# Patient Record
Sex: Male | Born: 1960 | Race: White | Hispanic: No | Marital: Married | Smoking: Never smoker
Health system: Southern US, Community
[De-identification: ages and names within clinical notes are randomized; demographics above are authoritative.]

## PROBLEM LIST (undated history)

## (undated) DIAGNOSIS — E785 Hyperlipidemia, unspecified: Secondary | ICD-10-CM

## (undated) DIAGNOSIS — G4730 Sleep apnea, unspecified: Secondary | ICD-10-CM

## (undated) DIAGNOSIS — H9190 Unspecified hearing loss, unspecified ear: Secondary | ICD-10-CM

## (undated) DIAGNOSIS — I10 Essential (primary) hypertension: Secondary | ICD-10-CM

## (undated) DIAGNOSIS — R7302 Impaired glucose tolerance (oral): Secondary | ICD-10-CM

## (undated) HISTORY — DX: Sleep apnea, unspecified: G47.30

## (undated) HISTORY — DX: Hyperlipidemia, unspecified: E78.5

## (undated) HISTORY — DX: Essential (primary) hypertension: I10

## (undated) HISTORY — DX: Impaired glucose tolerance (oral): R73.02

## (undated) HISTORY — PX: OTHER SURGICAL HISTORY: SHX169

---

## 1985-08-11 HISTORY — PX: ENDOLYMPHATIC SHUNT DECOMPRESSION: SHX1498

## 1986-08-11 HISTORY — PX: VESTIBULAR NERVE SECTION: SUR1439

## 1988-08-11 HISTORY — PX: HERNIA REPAIR: SHX51

## 2001-04-27 ENCOUNTER — Ambulatory Visit (HOSPITAL_BASED_OUTPATIENT_CLINIC_OR_DEPARTMENT_OTHER): Admission: RE | Admit: 2001-04-27 | Discharge: 2001-04-27 | Payer: Self-pay | Admitting: Orthopedic Surgery

## 2003-04-03 ENCOUNTER — Ambulatory Visit (HOSPITAL_BASED_OUTPATIENT_CLINIC_OR_DEPARTMENT_OTHER): Admission: RE | Admit: 2003-04-03 | Discharge: 2003-04-03 | Payer: Self-pay | Admitting: Orthopedic Surgery

## 2007-07-14 ENCOUNTER — Encounter: Admission: RE | Admit: 2007-07-14 | Discharge: 2007-07-14 | Payer: Self-pay | Admitting: Otolaryngology

## 2008-07-04 ENCOUNTER — Ambulatory Visit: Payer: Self-pay | Admitting: Internal Medicine

## 2008-07-10 ENCOUNTER — Telehealth: Payer: Self-pay | Admitting: Internal Medicine

## 2008-07-12 ENCOUNTER — Encounter: Payer: Self-pay | Admitting: Internal Medicine

## 2008-07-12 ENCOUNTER — Ambulatory Visit: Payer: Self-pay | Admitting: Internal Medicine

## 2008-07-18 ENCOUNTER — Telehealth: Payer: Self-pay | Admitting: Internal Medicine

## 2008-07-31 ENCOUNTER — Encounter: Payer: Self-pay | Admitting: Internal Medicine

## 2008-07-31 ENCOUNTER — Ambulatory Visit (HOSPITAL_COMMUNITY): Admission: RE | Admit: 2008-07-31 | Discharge: 2008-07-31 | Payer: Self-pay | Admitting: Internal Medicine

## 2010-03-06 ENCOUNTER — Emergency Department (HOSPITAL_COMMUNITY): Admission: EM | Admit: 2010-03-06 | Discharge: 2010-03-07 | Payer: Self-pay | Admitting: Emergency Medicine

## 2010-08-06 IMAGING — CR DG BE W/ AIR HIGH DENSITY
15 of 24 series · 15 of 24 positions shown · IV contrast (agent unspecified)
Comparison: Colonoscopy report dated 07/12/2008.

CLINICAL DATA: Incomplete colonoscopy for bright red blood in the
stools.

AIR-CONTRAST BARIUM ENEMA
TECHNIQUE: After obtaining a scout radiograph air-contrast barium
enema was performed under fluoroscopy using high-density barium.
Fluoroscopy Time: 8.1 minutes
Contrast: High density barium and air.

[run (1 of 12)]
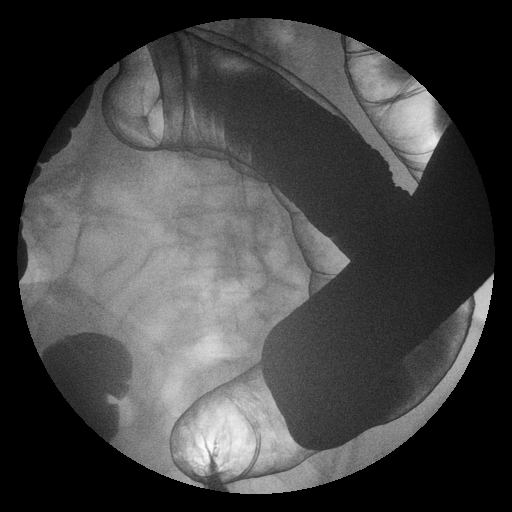

[run (2 of 12)]
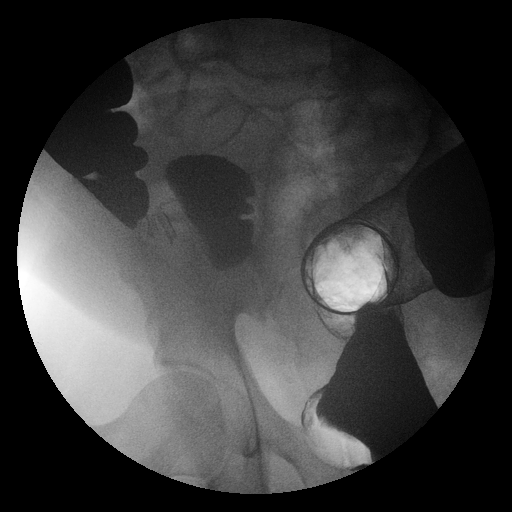

[run (3 of 12)]
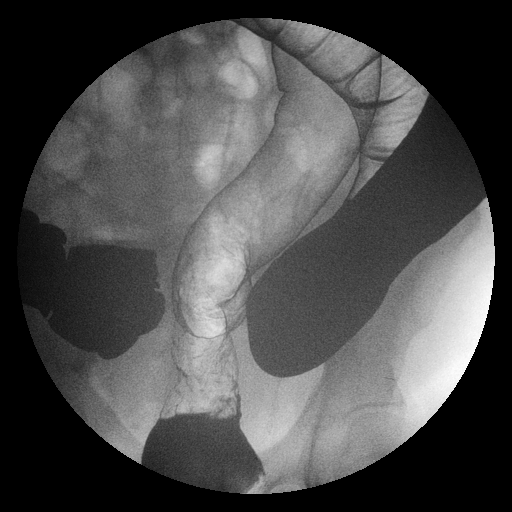

[run (4 of 12)]
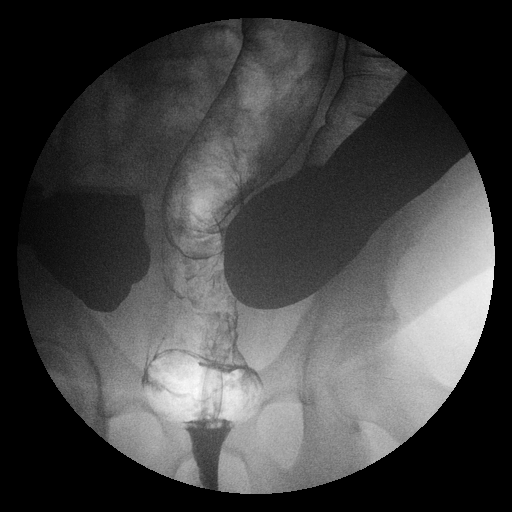

[run (5 of 12)]
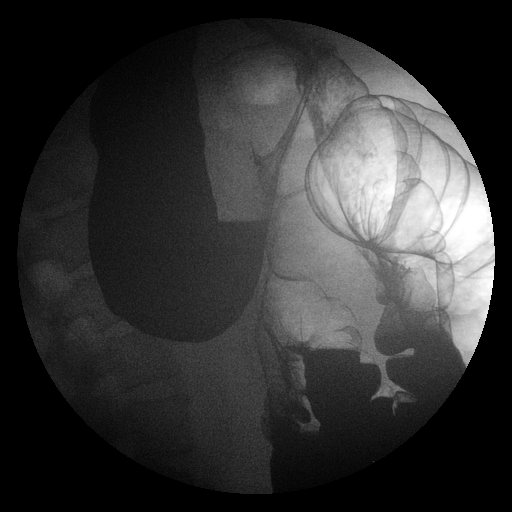

[run (6 of 12)]
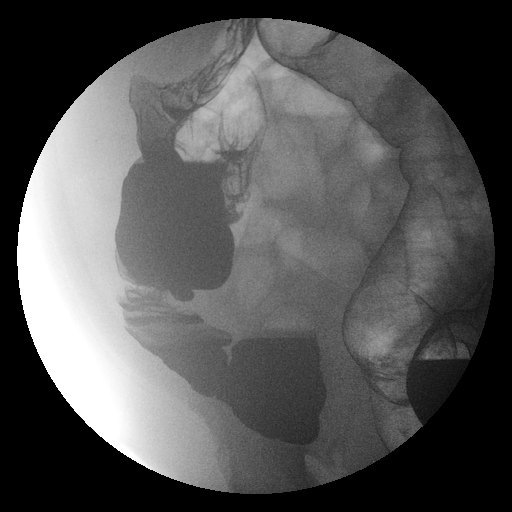

[run (7 of 12)]
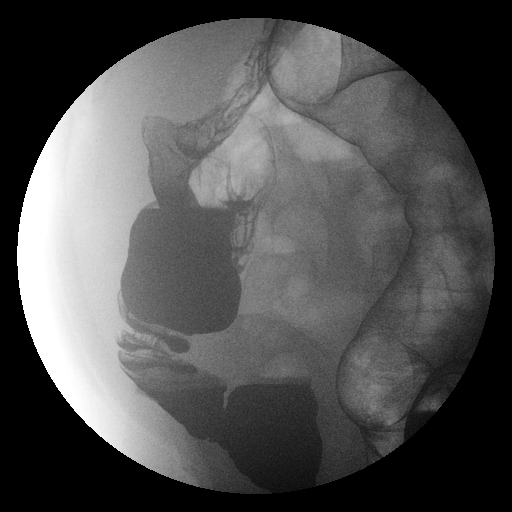

[run (8 of 12)]
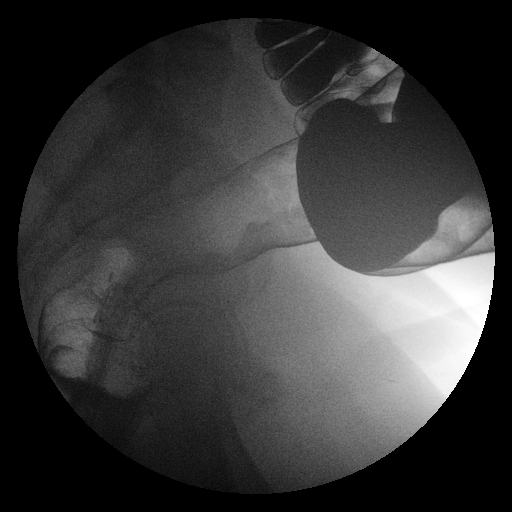

[run (9 of 12)]
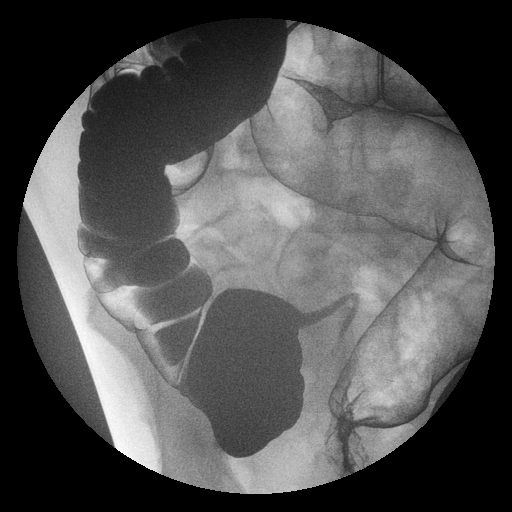

[run (10 of 12)]
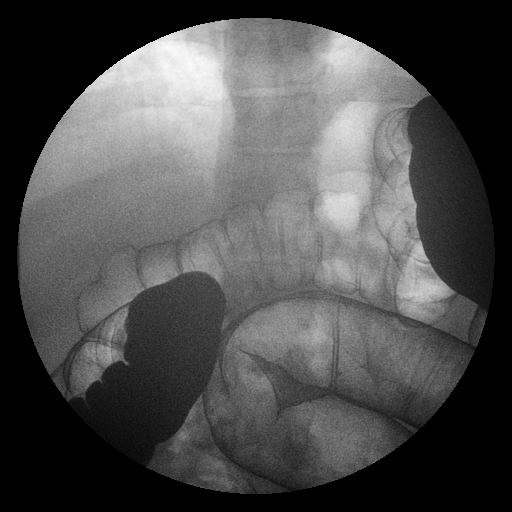

[run (11 of 12)]
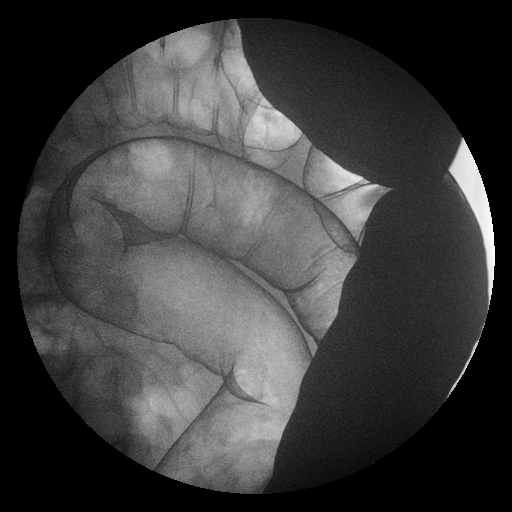

[run (12 of 12)]
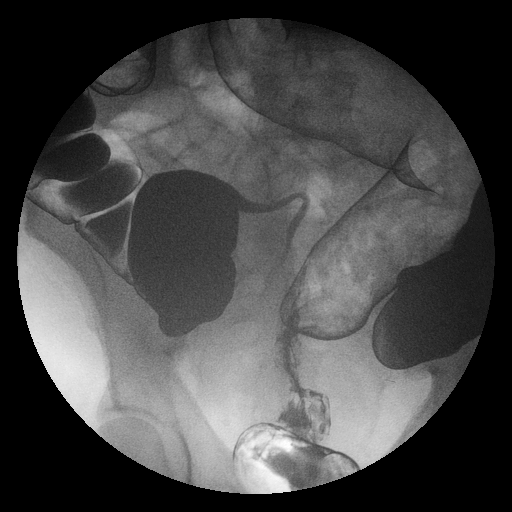

[view not recorded (1 of 3)]
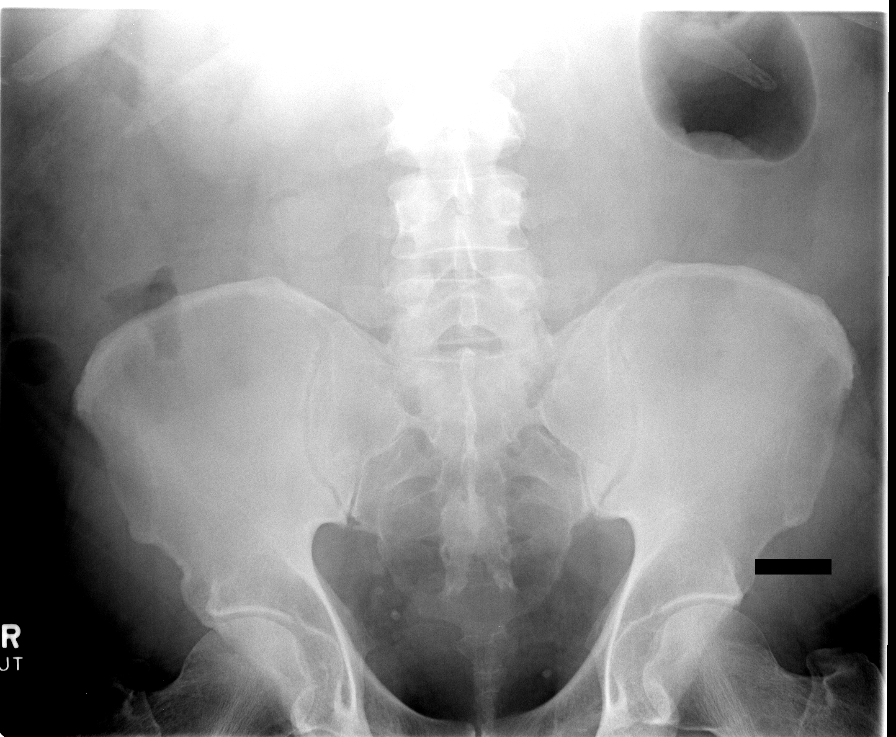

[view not recorded (2 of 3)]
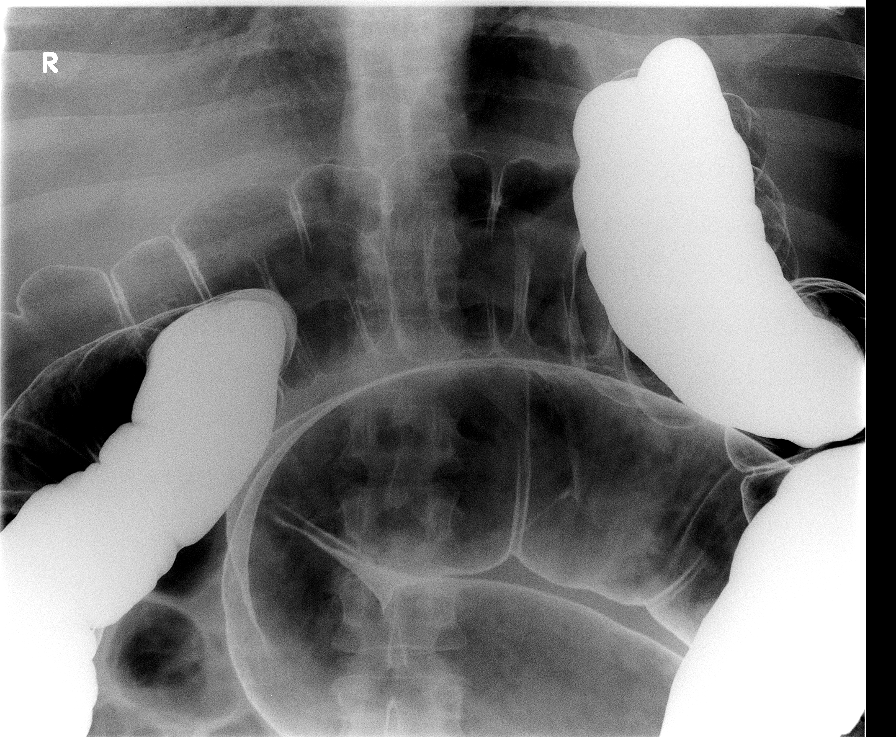

[view not recorded (3 of 3)]
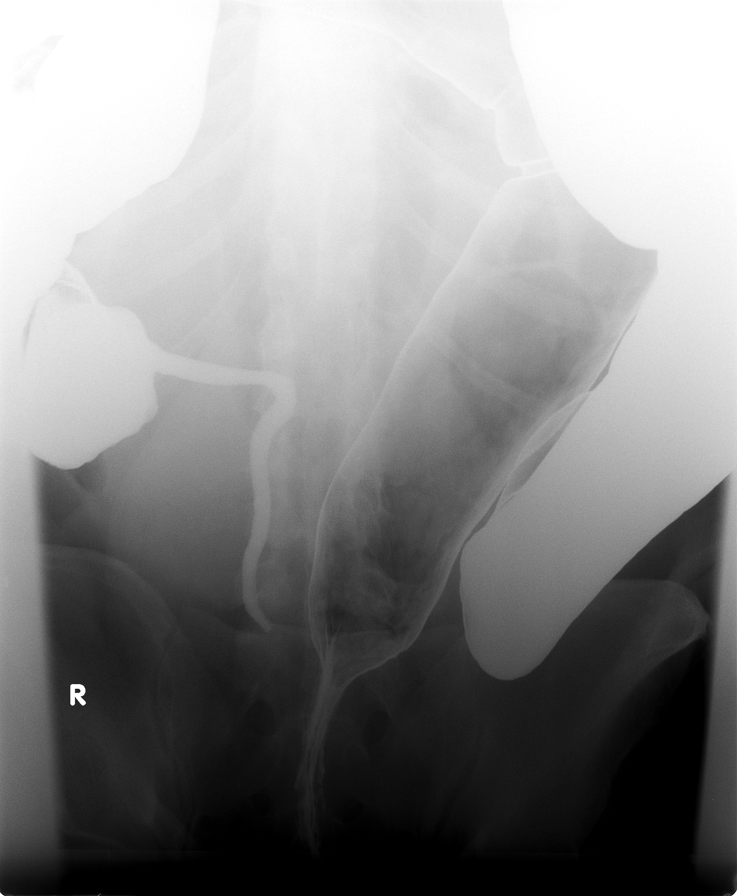

[15 of 24 positions shown; findings below may reference images not displayed]

FINDINGS: The preliminary supine radiograph of the abdomen
demonstrates a normal bowel gas pattern with gaseous distention of
the stomach.  Mild lower thoracic spine degenerative changes.

The barium enema demonstrates a tortuous, redundant sigmoid colon.
The colon is normal in caliber and contour with no constricting
lesions, masses or mucosal abnormalities seen.  Barium refluxed
into a normal appearing appendix.  Barium did not reflux into the
terminal ileum.
IMPRESSION: Normal examination.

## 2010-09-23 ENCOUNTER — Encounter: Payer: Self-pay | Admitting: Cardiovascular Disease

## 2010-10-26 LAB — COMPREHENSIVE METABOLIC PANEL
ALT: 41 U/L (ref 0–53)
Alkaline Phosphatase: 45 U/L (ref 39–117)
BUN: 12 mg/dL (ref 6–23)
CO2: 21 mEq/L (ref 19–32)
Chloride: 100 mEq/L (ref 96–112)
Creatinine, Ser: 0.74 mg/dL (ref 0.4–1.5)
GFR calc Af Amer: >60 mL/min (ref >60)
GFR calc non Af Amer: >60 mL/min (ref >60)
Glucose, Bld: 147 mg/dL — ABNORMAL HIGH (ref 70–99)

## 2010-10-26 LAB — URINALYSIS, ROUTINE W REFLEX MICROSCOPIC
Bilirubin Urine: NEGATIVE
Glucose, UA: NEGATIVE mg/dL
Hgb urine dipstick: NEGATIVE
Leukocytes, UA: NEGATIVE
Nitrite: NEGATIVE
Protein, ur: 30 mg/dL — AB

## 2010-10-26 LAB — CBC
Hemoglobin: 14.5 g/dL (ref 13.0–17.0)
MCV: 87.5 fL (ref 78.0–100.0)
Platelets: 262 10*3/uL (ref 150–400)
RBC: 4.74 MIL/uL (ref 4.22–5.81)
WBC: 18.3 10*3/uL — ABNORMAL HIGH (ref 4.0–10.5)

## 2010-10-26 LAB — URINE MICROSCOPIC-ADD ON

## 2010-10-26 LAB — DIFFERENTIAL
Basophils Absolute: 0 10*3/uL (ref 0.0–0.1)
Lymphocytes Relative: 4 % — ABNORMAL LOW (ref 12–46)
Monocytes Absolute: 1.1 10*3/uL — ABNORMAL HIGH (ref 0.1–1.0)
Monocytes Relative: 6 % (ref 3–12)
Neutrophils Relative %: 90 % — ABNORMAL HIGH (ref 43–77)

## 2010-10-26 LAB — CULTURE, BLOOD (ROUTINE X 2)

## 2010-12-19 ENCOUNTER — Encounter: Payer: Self-pay | Admitting: Cardiovascular Disease

## 2010-12-20 ENCOUNTER — Ambulatory Visit (INDEPENDENT_AMBULATORY_CARE_PROVIDER_SITE_OTHER): Payer: BC Managed Care – PPO | Admitting: Cardiovascular Disease

## 2010-12-20 ENCOUNTER — Encounter: Payer: Self-pay | Admitting: Cardiovascular Disease

## 2010-12-20 VITALS — BP 127/78 | HR 85 | Resp 18 | Ht 71.0 in | Wt 290.0 lb

## 2010-12-20 DIAGNOSIS — R0600 Dyspnea, unspecified: Secondary | ICD-10-CM

## 2010-12-20 DIAGNOSIS — R609 Edema, unspecified: Secondary | ICD-10-CM | POA: Insufficient documentation

## 2010-12-20 DIAGNOSIS — R0609 Other forms of dyspnea: Secondary | ICD-10-CM

## 2010-12-20 DIAGNOSIS — E785 Hyperlipidemia, unspecified: Secondary | ICD-10-CM | POA: Insufficient documentation

## 2010-12-20 DIAGNOSIS — I10 Essential (primary) hypertension: Secondary | ICD-10-CM | POA: Insufficient documentation

## 2010-12-20 NOTE — Assessment & Plan Note (Signed)
Continue fiber.  F/U Dr [name] for blood work

## 2010-12-20 NOTE — Progress Notes (Signed)
Marc [name] is seen today at the request of Dr [name].  He had gained 50lbs over the last 2 years.  Accompanied by dyspnea and LE edema.  Now on CPAP.  No SSCP but wants to make sure it is safe to start and exercise program.  Dyspnea improved some with CPAP.  CRF;f eleveated lipids and HTN.  Compliant with meds.  Denies excessive ETOH.  Overeater and too many carbs.  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  Given chronic dyspnea and risk factors it is reasonable to do ETT and echo.  Discussed low carb diet. And lapband options if diet and exercise not working.  Risk of type 2 DM discussed  ROS: Denies fever, malais, weight loss, blurry vision, decreased visual acuity, cough, sputum, SOB, hemoptysis, pleuritic pain, palpitaitons, heartburn, abdominal pain, melena, lower extremity edema, claudication, or rash.   General: Affect appropriate Healthy:  appears stated age HEENT: normal Neck supple with no adenopathy JVP normal no bruits no thyromegaly Lungs clear with no wheezing and good diaphragmatic motion Heart:  S1/S2 no murmur,rub, gallop or click PMI normal Abdomen: benighn, BS positve, no tenderness, no AAA no bruit.  No HSM or HJR Distal pulses intact with no bruits Plus one bilateral  edema Neuro non-focal Skin warm and dry No muscular weakness  Medications Current Outpatient Prescriptions  Medication Sig Dispense Refill  . Ascorbic Acid (VITAMIN C) 1000 MG tablet Take 1,000 mg by mouth daily.        . diclofenac (VOLTAREN) 75 MG EC tablet Take 75 mg by mouth 2 (two) times daily.        Marland Kitchen FIBER COMPLETE PO Take by mouth.        . fish oil-omega-3 fatty acids 1000 MG capsule Take 1 g by mouth daily.        Marland Kitchen lisinopril (PRINIVIL,ZESTRIL) 20 MG tablet Take 20 mg by mouth daily.        Marland Kitchen omeprazole (PRILOSEC) 20 MG capsule Take 20 mg by mouth daily as needed.       . Potassium Gluconate 595 MG CAPS Take by mouth daily.        . vitamin B-12 (CYANOCOBALAMIN) 1000 MCG tablet Take  1,000 mcg by mouth daily.        Marland Kitchen DISCONTD: Testosterone (FORTESTA) 10 MG/ACT (2%) GEL Place onto the skin as directed.          Allergies Vancomycin  Family History: Family History  Problem Relation Age of Onset  . Diabetes Father   . Other Mother     A & W  (?)    Social History: History   Social History  . Marital Status: Married    Spouse Name: N/A    Number of Children: N/A  . Years of Education: N/A   Occupational History  . Not on file.   Social History Main Topics  . Smoking status: Former Games developer  . Smokeless tobacco: Not on file   Comment: pt smoked 6 months at the age of appox 50 yrs old  . Alcohol Use: Yes     OCCASIONALLY  . Drug Use: No  . Sexually Active: Not on file   Other Topics Concern  . Not on file   Social History Narrative  . No narrative on file    Electrocardiogram:  NSR 85 Normal ECG  Assessment and Plan

## 2010-12-20 NOTE — Patient Instructions (Signed)
Your physician has requested that you have an echocardiogram. Echocardiography is a painless test that uses sound waves to create images of your heart. It provides your doctor with information about the size and shape of your heart and how well your heart's chambers and valves are working. This procedure takes approximately one hour. There are no restrictions for this procedure.   Your physician has requested that you have an exercise tolerance test. For further information please visit [email or web address] Please also follow instruction sheet, as given. WITH SCOTT OR DR Eden Emms

## 2010-12-20 NOTE — Assessment & Plan Note (Signed)
Obesity hypoventilation syndrome.  Continue CPAP.  Low carb diet CXR normal.  F/U echo and ETT to R/O structural heart disease and pulmonary hypertension

## 2010-12-20 NOTE — Assessment & Plan Note (Signed)
Well controlled.  Continue current medications and low sodium Dash type diet.    

## 2010-12-20 NOTE — Assessment & Plan Note (Signed)
Consider adding diuretic to ACE or changing to Hyzaar.  F/U Dr [name].  Low sodium diet

## 2010-12-27 NOTE — Op Note (Signed)
NAME:  Marc [name], Marc [name]                           ACCOUNT NO.:  [id number]   MEDICAL RECORD NO.:  [id number]                   PATIENT TYPE:  AMB   LOCATION:  DSC                                  FACILITY:  [facility]   PHYSICIAN:  Robert A. Thurston Hole, M.D.              DATE OF BIRTH:  May 07, 1961   DATE OF PROCEDURE:  04/03/2003  DATE OF DISCHARGE:                                 OPERATIVE REPORT   PREOPERATIVE DIAGNOSIS:  Left knee medial meniscal tear.   POSTOPERATIVE DIAGNOSIS:  Left knee medial and lateral meniscal tears.   OPERATION PERFORMED:  Left knee examination under anesthesia followed by  arthroscopic partial medial and lateral meniscectomies.   SURGEON:  Elana Alm. Thurston Hole, M.D.   ANESTHESIA:  General.   OPERATIVE TIME:  30 minutes.   COMPLICATIONS:  None.   INDICATIONS FOR PROCEDURE:  [name] is a 50 year old gentleman who has had  significant left knee pain for the past months increasing in nature with  signs and symptoms and MRI documenting a medial meniscal tear and he is now  to undergo arthroscopy due to failed conservative care.   DESCRIPTION OF PROCEDURE:  [name] was brought to the operating room on  April 03, 2003 and placed on the operating table in supine position.  After  being placed under general anesthesia, his left knee was examined under  anesthesia. He had full range of motion and his knee was stable to  ligamentous exam.  The left leg was then prepped using sterile DuraPrep and  draped using sterile technique.  Originally, through an inferolateral  portal, the arthroscope with a pump attached was placed and through an  inferomedial portal, an arthroscopic probe was placed.  On initial  inspection of the medial compartment, the articular cartilage showed mild  grade 1 and 2 chondromalacia.  Medial meniscus showed tearing of the  posteromedial horn of which 40 to 50% was resected back to a stable rim.  The intercondylar notch was inspected and  anterior and posterior cruciate  ligaments were normal.  Lateral compartment inspected.  The articular  cartilage was normal.  Lateral meniscus showed a tear of the anterior and  lateral horn 20% which was resected back to a stable rim.  The  patellofemoral joint  articular cartilage was normal.  The patella tracked  normally.  Moderate synovitis in the medial and lateral gutters was  debrided.  Otherwise they were free of pathology.  After this was done, it  was felt that all pathology had been satisfactorily addressed.  The  instruments were removed.  Portals were closed with 3-0 nylon suture and  injected with 0.25% Marcaine with epinephrine and 4 mg of morphine. The  patient then had sterile dressings applied, awakened and taken to recovery  room in stable condition.    FOLLOW UP:  [name] will be followed as an outpatient on Vicodin and  Naprosyn.  See him back in the office in week for suture removal and follow-  up.                                               Robert A. Thurston Hole, M.D.    RAW/MEDQ  D:  04/03/2003  T:  04/03/2003  Job:  [id number]

## 2010-12-27 NOTE — Op Note (Signed)
[place]  Patient:    Marc [name], Marc [name] Visit Number: [id number] MRN: [id number]          Service Type: DSU Location: [facility] Attending Physician:  Twana First Dictated by:   Elana Alm Thurston Hole, M.D. Proc. Date: 04/27/01 Admit Date:  04/27/2001                             Operative Report  PREOPERATIVE DIAGNOSIS:  Right knee medial meniscus tear.  POSTOPERATIVE DIAGNOSIS:  Right knee medial meniscus tear.  PROCEDURES:  Right knee examination under anesthesia followed by arthroscopic partial medial meniscectomy.  SURGEON:  Elana Alm. Thurston Hole, M.D.  ASSISTANT:  Julien Girt, P.A.  ANESTHESIA:  General.  OPERATIVE TIME:  45 minutes.  COMPLICATIONS:  None.  INDICATIONS FOR PROCEDURE:  Marc [name] is a 50 year old gentleman who sustained a twisting injury to his right knee approximately one and a half years ago while snow skiing.  Persistent pain and intermittent swelling in the knee with exam and MRI documenting a medial meniscus tear.  He has failed conservative care and is now to undergo arthroscopy.  DESCRIPTION OF PROCEDURE:  Marc [name] was brought to the operating room on April 27, 2001, and placed on the operating table in the supine position. His right knee was examined under anesthesia.  Range of motion 0-125 degrees with 1-2+ crepitation.  Knee stable to ligamentous exam with normal patella tracking.  The right leg was prepped using sterile Betadine and draped using sterile technique.  Originally through an inferolateral portal, arthroscope with a pump attached was placed and through an inferomedial an arthroscopic probe was placed.  On initial inspection of the medial compartment, the articular cartilage, medial femoral condyle, and medial tibial plateau showed mild grade 1-2 chondromalacia.  The medial meniscus was probed and he had a tear of the posterior medial horn of which 30-40% was resected back to a stable rim.   The intercondylar notch was inspected.  Anterior and posterior cruciate ligaments were normal.  The lateral compartment was inspected.  The articular cartilage, lateral femoral condyle, and lateral tibial plateau were normal.  The lateral meniscus was probed and it was found to be normal.  The patellofemoral joint was inspected.  The articular cartilage in this joint was normal.  The patella tracked normally.  Moderate synovitis of the medial and lateral gutters were debrided and the synovial bleeders were cauterized. Otherwise the medial and lateral gutters were free of pathology.  After this was done, it was felt that all pathology had been satisfactorily addressed. The instruments were removed.  The portals were closed with 3-0 nylon suture and injected with 0.25% Marcaine with epinephrine and 4 mg of morphine. Sterile dressings were applied.  The patient was awaken and taken to recovery room in a stable condition.  FOLLOW-UP CARE:  Marc [name] will be followed as an outpatient on Vicodin and Naprosyn.  I will see him back in the office in a week for sutures out and follow-up. Dictated by:   Elana Alm Thurston Hole, M.D. Attending Physician:  Twana First DD:  04/27/01 TD:  04/27/01 Job: [id number]

## 2011-01-08 ENCOUNTER — Ambulatory Visit (INDEPENDENT_AMBULATORY_CARE_PROVIDER_SITE_OTHER): Payer: BC Managed Care – PPO | Admitting: Cardiovascular Disease

## 2011-01-08 ENCOUNTER — Ambulatory Visit (HOSPITAL_COMMUNITY): Payer: BC Managed Care – PPO | Attending: Cardiovascular Disease

## 2011-01-08 DIAGNOSIS — M7989 Other specified soft tissue disorders: Secondary | ICD-10-CM | POA: Insufficient documentation

## 2011-01-08 DIAGNOSIS — R0609 Other forms of dyspnea: Secondary | ICD-10-CM

## 2011-01-08 DIAGNOSIS — I379 Nonrheumatic pulmonary valve disorder, unspecified: Secondary | ICD-10-CM | POA: Insufficient documentation

## 2011-01-08 DIAGNOSIS — R0989 Other specified symptoms and signs involving the circulatory and respiratory systems: Secondary | ICD-10-CM

## 2011-01-08 DIAGNOSIS — I059 Rheumatic mitral valve disease, unspecified: Secondary | ICD-10-CM | POA: Insufficient documentation

## 2011-01-08 DIAGNOSIS — E662 Morbid (severe) obesity with alveolar hypoventilation: Secondary | ICD-10-CM | POA: Insufficient documentation

## 2011-01-08 DIAGNOSIS — R0600 Dyspnea, unspecified: Secondary | ICD-10-CM

## 2011-01-08 NOTE — Progress Notes (Signed)
Exercise Treadmill Test  Pre-Exercise Testing Evaluation Rhythm: normal sinus  Rate: 84   PR:  .09 QRS:  .09  QT:  .36 QTc: .43   ST Segments:  no significant ST changes at rest     Test  Exercise Tolerance Test Ordering MD: Charlton Haws, MD  Interpreting MD:  Charlton Haws, MD  Unique Test No: 1  Treadmill:  1  Indication for ETT: exertional dyspnea  Contraindication to ETT: No   Stress Modality: exercise - treadmill  Cardiac Imaging Performed: non   Protocol: standard Bruce - maximal  Max BP:  202/98  Max MPHR (bpm):  171 85% MPR (bpm):  145  MPHR obtained (bpm):  172 % MPHR obtained:  100  Reached 85% MPHR (min:sec): 6:30 Total Exercise Time (min-sec):  7:30  Workload in METS:  12.3 Borg Scale: 18  Reason ETT Terminated:  fatigue    ST Segment Analysis At Rest: normal ST segments - no evidence of significant ST depression With Exercise: no evidence of significant ST depression Stages advanced at 2:00 intervals  Other Information Arrhythmia:  No Angina during ETT:  absent (0) Quality of ETT:  diagnostic  ETT Interpretation:  normal - no evidence of ischemia by ST analysis  Comments: Normal ETT ok to continue exercise program   Recommendations: Continue weight loss efforts  F/U with Dr. Eden Emms in 6 months  Charlton Haws

## 2011-01-23 ENCOUNTER — Telehealth: Payer: Self-pay | Admitting: *Deleted

## 2011-01-23 NOTE — Telephone Encounter (Signed)
RE STRESS AND ECHO RESULTS AND WILL SEE DR Eden Emms IN 6 MONTHS RECALL ENTERED  IN SYSTEM./CY

## 2011-01-24 NOTE — Telephone Encounter (Signed)
PT AWARE OF STRESS AND ECHO RESULTS. AND OF THE NEED TO BE SEEN IN 6 MONTHS/CY

## 2011-07-15 ENCOUNTER — Encounter: Payer: Self-pay | Admitting: Cardiovascular Disease

## 2011-07-15 ENCOUNTER — Ambulatory Visit (INDEPENDENT_AMBULATORY_CARE_PROVIDER_SITE_OTHER): Payer: BC Managed Care – PPO | Admitting: Cardiovascular Disease

## 2011-07-15 DIAGNOSIS — E785 Hyperlipidemia, unspecified: Secondary | ICD-10-CM

## 2011-07-15 DIAGNOSIS — I10 Essential (primary) hypertension: Secondary | ICD-10-CM

## 2011-07-15 DIAGNOSIS — R0609 Other forms of dyspnea: Secondary | ICD-10-CM

## 2011-07-15 DIAGNOSIS — R609 Edema, unspecified: Secondary | ICD-10-CM

## 2011-07-15 DIAGNOSIS — R0600 Dyspnea, unspecified: Secondary | ICD-10-CM

## 2011-07-15 NOTE — Progress Notes (Signed)
Patient ID: Marc [name], male   DOB: 1960/09/09, 50 y.o.   MRN: [id number] Marc [name] is seen today for F/U of borderline htn and dyspnea.  Primary is Marc [name].  He has lost about 90 lbs on weight watchers and walking.  I congratulated him on this.  He had a BP chart and off lisinopril he has had good BP's.  Denies SSCP.  ETT in May was normal.  He has two children that attended [address] and is going there this weekend for the [employer].  Compliant with meds.    ROS: Denies fever, malais, weight loss, blurry vision, decreased visual acuity, cough, sputum, SOB, hemoptysis, pleuritic pain, palpitaitons, heartburn, abdominal pain, melena, lower extremity edema, claudication, or rash.  All other systems reviewed and negative  General: Affect appropriate Healthy:  appears stated age HEENT: normal Neck supple with no adenopathy JVP normal no bruits no thyromegaly Lungs clear with no wheezing and good diaphragmatic motion Heart:  S1/S2 no murmur,rub, gallop or click PMI normal Abdomen: benighn, BS positve, no tenderness, no AAA no bruit.  No HSM or HJR Distal pulses intact with no bruits No edema Neuro non-focal Skin warm and dry No muscular weakness   Current Outpatient Prescriptions  Medication Sig Dispense Refill  . Calcium Carb-Cholecalciferol (CALCIUM-VITAMIN D) 500-400 MG-UNIT TABS Take 1 tablet by mouth daily.        . diclofenac (VOLTAREN) 75 MG EC tablet Take 75 mg by mouth 2 (two) times daily.        Marland Kitchen FIBER COMPLETE PO Take 1 tablet by mouth daily.       . fish oil-omega-3 fatty acids 1000 MG capsule Take 1 g by mouth daily.        Marland Kitchen ibuprofen (ADVIL,MOTRIN) 200 MG tablet Take 200 mg by mouth every 6 (six) hours as needed.          Allergies  Vancomycin  Electrocardiogram:  Assessment and Plan

## 2011-07-15 NOTE — Assessment & Plan Note (Signed)
Well controlled.  Continue current medications and low sodium Dash type diet.   D/C lisinopril for now as weight loss has lowered BP

## 2011-07-15 NOTE — Assessment & Plan Note (Signed)
Resloved continue low sodium diet

## 2011-07-15 NOTE — Assessment & Plan Note (Signed)
Resolved likely related to weight and OSA when heavy

## 2011-07-15 NOTE — Patient Instructions (Signed)
Your physician wants you to follow-up in:  12 months.  You will receive a reminder letter in the mail two months in advance. If you don't receive a letter, please call our office to schedule the follow-up appointment.   

## 2011-07-15 NOTE — Assessment & Plan Note (Signed)
Cholesterol is at goal.  Continue current dose of statin and diet Rx.  No myalgias or side effects.  F/U  LFT's in 6 months. No results found for this basename: LDLCALC   F/U labs with [name] in January

## 2012-07-15 ENCOUNTER — Ambulatory Visit: Payer: Self-pay | Admitting: Cardiovascular Disease

## 2012-08-17 ENCOUNTER — Ambulatory Visit: Payer: Self-pay | Admitting: Cardiovascular Disease

## 2012-11-25 ENCOUNTER — Encounter: Payer: Self-pay | Admitting: Cardiovascular Disease

## 2013-11-08 ENCOUNTER — Other Ambulatory Visit: Payer: Self-pay | Admitting: Orthopedic Surgery

## 2013-11-09 ENCOUNTER — Encounter (HOSPITAL_BASED_OUTPATIENT_CLINIC_OR_DEPARTMENT_OTHER): Payer: Self-pay | Admitting: *Deleted

## 2013-11-09 NOTE — Progress Notes (Signed)
No meds-no labs needed Lost 100 lb-off all htn meds-saw dr Eden Emmsnishan 2012-released

## 2013-11-14 NOTE — H&P (Addendum)
Marc Marc [name] is an 53 y.o. male.   Chief Complaint: c/o chronic and progressive numbness and tingling of the right hand HPI: .  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  He has recently lost over 100 pounds through diet, exercise and straightening out several stressors in his life.    He has been experiencing triggering of his right small finger off and on for two months. He has had triggering of left long and ring fingers and now has a 15 degree flexion contracture of his left long finger PIP joint due to chronic stenosing tenosynovitis.  He reports awakening at night with a dead feeling in his hands.  He does not articulate numbness, but from the sound of his sleep impairment it sounds as if he is experiencing carpal tunnel syndrome symptoms.     Past Medical History  Diagnosis Date  . IGT (impaired glucose tolerance)   . HLD (hyperlipidemia)   . HTN (hypertension)     lost 100 lb-off meds  . Sleep apnea     cPAP-lost 100lb-does not use anymore  . HOH (hard of hearing)     right ear    Past Surgical History  Procedure Laterality Date  . Orthoscopic knee surgery      both knee  . Endolymphatic shunt decompression  1987    rt ear  . Vestibular nerve section  1988  . Hernia repair  1990    umb     Family History  Problem Relation Age of Onset  . Diabetes Father   . Other Mother     A & W  (?)   Social History:  reports that he has never smoked. He does not have any smokeless tobacco history on file. He reports that he drinks alcohol. He reports that he does not use illicit drugs.  Allergies:  Allergies  Allergen Reactions  . Vancomycin     REACTION: swelling, hives    No prescriptions prior to admission    No results found for this or any previous visit (from the past 48 hour(s)).  No results found.   Pertinent items are noted in HPI.  Height 5\' 10"  (1.778 m), weight 106.595 kg (235  lb).  General appearance: alert Head: Normocephalic, without obvious abnormality Neck: supple, symmetrical, trachea midline Resp: clear to auscultation bilaterally Cardio: regular rate and rhythm GI: normal findings: bowel sounds normal Extremities:.   He has positive provocative signs of carpal tunnel syndrome.  He has locking stenosing tenosynovitis of his right  Index and long fingers.   Plain films of his hands, AP, lateral and oblique demonstrate normal bony anatomy.  He does not show signs of significant osteoarthritis.   I asked Dr. Wadie [name] to perform detailed electrodiagnostic studies. These confirm bilateral carpal tunnel syndrome right worse than left.  His ulnar nerve conduction parameters were normal.   Pulses: 2+ and symmetric Skin: normal Neurologic: Grossly normal  Assessment/Marc [name] Impression: Right CTS, right index and long stenosing tenosynovitis.  Untreated sleep apnea.  Marc [name]: TO the OR for right CTR, release index and long A-1 pulleys.The procedure, risks,benefits and post-op course were discussed with the patient at length and they were in agreement with the Marc [name]. With sleep apnea, Dr Marc Marc [name] has requested that we proceed with MAC anesthesia.  Marc [name],Marc Marc [name] 11/14/2013, 3:58 PM   H&P documentation: 11/15/2013  -History and Physical Reviewed  -Patient has been re-examined  -No  change in the Marc [name] of care  Wyn Forsterobert V Marc Marc [name] Jr, MD

## 2013-11-15 ENCOUNTER — Encounter (HOSPITAL_BASED_OUTPATIENT_CLINIC_OR_DEPARTMENT_OTHER)
Admission: RE | Disposition: A | Discharge status: Home / Self Care | Payer: Self-pay | Source: Ambulatory Visit | Attending: Orthopedic Surgery

## 2013-11-15 ENCOUNTER — Encounter (HOSPITAL_BASED_OUTPATIENT_CLINIC_OR_DEPARTMENT_OTHER): Payer: Self-pay | Admitting: Orthopedic Surgery

## 2013-11-15 ENCOUNTER — Encounter (HOSPITAL_BASED_OUTPATIENT_CLINIC_OR_DEPARTMENT_OTHER): Payer: BC Managed Care – PPO | Admitting: Anesthesiology

## 2013-11-15 ENCOUNTER — Ambulatory Visit (HOSPITAL_BASED_OUTPATIENT_CLINIC_OR_DEPARTMENT_OTHER)
Admission: RE | Admit: 2013-11-15 | Discharge: 2013-11-15 | Disposition: A | Discharge status: Home / Self Care | Payer: BC Managed Care – PPO | Source: Ambulatory Visit | Attending: Orthopedic Surgery | Admitting: Orthopedic Surgery

## 2013-11-15 ENCOUNTER — Ambulatory Visit (HOSPITAL_BASED_OUTPATIENT_CLINIC_OR_DEPARTMENT_OTHER): Payer: BC Managed Care – PPO | Admitting: Anesthesiology

## 2013-11-15 DIAGNOSIS — R7309 Other abnormal glucose: Secondary | ICD-10-CM | POA: Insufficient documentation

## 2013-11-15 DIAGNOSIS — E785 Hyperlipidemia, unspecified: Secondary | ICD-10-CM | POA: Insufficient documentation

## 2013-11-15 DIAGNOSIS — G5600 Carpal tunnel syndrome, unspecified upper limb: Secondary | ICD-10-CM | POA: Insufficient documentation

## 2013-11-15 DIAGNOSIS — M65839 Other synovitis and tenosynovitis, unspecified forearm: Secondary | ICD-10-CM | POA: Insufficient documentation

## 2013-11-15 DIAGNOSIS — I10 Essential (primary) hypertension: Secondary | ICD-10-CM | POA: Insufficient documentation

## 2013-11-15 DIAGNOSIS — M6530 Trigger finger, unspecified finger: Secondary | ICD-10-CM | POA: Insufficient documentation

## 2013-11-15 DIAGNOSIS — G4730 Sleep apnea, unspecified: Secondary | ICD-10-CM | POA: Insufficient documentation

## 2013-11-15 DIAGNOSIS — H9190 Unspecified hearing loss, unspecified ear: Secondary | ICD-10-CM | POA: Insufficient documentation

## 2013-11-15 DIAGNOSIS — M65849 Other synovitis and tenosynovitis, unspecified hand: Secondary | ICD-10-CM

## 2013-11-15 HISTORY — DX: Unspecified hearing loss, unspecified ear: H91.90

## 2013-11-15 HISTORY — PX: TRIGGER FINGER RELEASE: SHX641

## 2013-11-15 HISTORY — PX: CARPAL TUNNEL RELEASE: SHX101

## 2013-11-15 LAB — POCT HEMOGLOBIN-HEMACUE: Hemoglobin: 14.2 g/dL (ref 13.0–17.0)

## 2013-11-15 SURGERY — CARPAL TUNNEL RELEASE
Anesthesia: Monitor Anesthesia Care | Site: Wrist | Laterality: Right

## 2013-11-15 MED ORDER — LIDOCAINE HCL (CARDIAC) 20 MG/ML IV SOLN
INTRAVENOUS | Status: DC | PRN
  Administered 2013-11-15: 40 mg via INTRAVENOUS

## 2013-11-15 MED ORDER — LIDOCAINE HCL 2 % IJ SOLN
INTRAMUSCULAR | Status: DC | PRN
  Administered 2013-11-15: 11.5 mL

## 2013-11-15 MED ORDER — CEFAZOLIN SODIUM-DEXTROSE 2-3 GM-% IV SOLR: 2.0000 g | INTRAVENOUS | Status: DC

## 2013-11-15 MED ORDER — FENTANYL CITRATE 0.05 MG/ML IJ SOLN
INTRAMUSCULAR | Status: AC
  Filled 2013-11-15: qty 4

## 2013-11-15 MED ORDER — PROPOFOL 10 MG/ML IV BOLUS
INTRAVENOUS | Status: AC
  Filled 2013-11-15: qty 40

## 2013-11-15 MED ORDER — PROPOFOL 10 MG/ML IV BOLUS
INTRAVENOUS | Status: AC
  Filled 2013-11-15: qty 20

## 2013-11-15 MED ORDER — CHLORHEXIDINE GLUCONATE 4 % EX LIQD: 60.0000 mL | Freq: Once | CUTANEOUS | Status: DC

## 2013-11-15 MED ORDER — MIDAZOLAM HCL 5 MG/5ML IJ SOLN
INTRAMUSCULAR | Status: DC | PRN
  Administered 2013-11-15: 2 mg via INTRAVENOUS

## 2013-11-15 MED ORDER — PROPOFOL INFUSION 10 MG/ML OPTIME
INTRAVENOUS | Status: DC | PRN
  Administered 2013-11-15: 100 ug/kg/min via INTRAVENOUS

## 2013-11-15 MED ORDER — ONDANSETRON HCL 4 MG/2ML IJ SOLN
INTRAMUSCULAR | Status: DC | PRN
  Administered 2013-11-15: 4 mg via INTRAVENOUS

## 2013-11-15 MED ORDER — PROPOFOL 10 MG/ML IV BOLUS
INTRAVENOUS | Status: DC | PRN
  Administered 2013-11-15 (×2): 50 mg via INTRAVENOUS

## 2013-11-15 MED ORDER — FENTANYL CITRATE 0.05 MG/ML IJ SOLN
INTRAMUSCULAR | Status: DC | PRN
Start: 2013-11-15 — End: 2013-11-15
  Administered 2013-11-15 (×2): 50 ug via INTRAVENOUS
  Administered 2013-11-15: 100 ug via INTRAVENOUS

## 2013-11-15 MED ORDER — PROPOFOL 10 MG/ML IV BOLUS
INTRAVENOUS | Status: AC
  Filled 2013-11-15: qty 60

## 2013-11-15 MED ORDER — FENTANYL CITRATE 0.05 MG/ML IJ SOLN: 50.0000 ug | INTRAMUSCULAR | Status: DC | PRN

## 2013-11-15 MED ORDER — MIDAZOLAM HCL 2 MG/2ML IJ SOLN
INTRAMUSCULAR | Status: AC
  Filled 2013-11-15: qty 2

## 2013-11-15 MED ORDER — LACTATED RINGERS IV SOLN
INTRAVENOUS | Status: DC
  Administered 2013-11-15 (×2): via INTRAVENOUS

## 2013-11-15 MED ORDER — OXYCODONE-ACETAMINOPHEN 5-325 MG PO TABS: ORAL_TABLET | ORAL | Status: AC

## 2013-11-15 MED ORDER — MIDAZOLAM HCL 2 MG/2ML IJ SOLN: 1.0000 mg | INTRAMUSCULAR | Status: DC | PRN

## 2013-11-15 SURGICAL SUPPLY — 43 items
BANDAGE ADH SHEER 1  50/CT (GAUZE/BANDAGES/DRESSINGS) IMPLANT
BANDAGE ELASTIC 3 VELCRO ST LF (GAUZE/BANDAGES/DRESSINGS) ×4 IMPLANT
BLADE SURG 15 STRL LF DISP TIS (BLADE) ×2 IMPLANT
BLADE SURG 15 STRL SS (BLADE) ×2
BNDG COHESIVE 3X5 TAN STRL LF (GAUZE/BANDAGES/DRESSINGS) ×4 IMPLANT
BNDG ESMARK 4X9 LF (GAUZE/BANDAGES/DRESSINGS) ×4 IMPLANT
BRUSH SCRUB EZ PLAIN DRY (MISCELLANEOUS) ×4 IMPLANT
CLOSURE WOUND 1/2 X4 (GAUZE/BANDAGES/DRESSINGS) ×1
CORDS BIPOLAR (ELECTRODE) ×4 IMPLANT
COVER MAYO STAND STRL (DRAPES) ×4 IMPLANT
COVER TABLE BACK 60X90 (DRAPES) ×4 IMPLANT
CUFF TOURNIQUET SINGLE 18IN (TOURNIQUET CUFF) ×4 IMPLANT
DECANTER SPIKE VIAL GLASS SM (MISCELLANEOUS) IMPLANT
DRAPE EXTREMITY T 121X128X90 (DRAPE) ×4 IMPLANT
DRAPE SURG 17X23 STRL (DRAPES) ×4 IMPLANT
GLOVE BIO SURGEON STRL SZ 6.5 (GLOVE) ×6 IMPLANT
GLOVE BIO SURGEONS STRL SZ 6.5 (GLOVE) ×2
GLOVE BIOGEL M STRL SZ7.5 (GLOVE) ×4 IMPLANT
GLOVE BIOGEL PI IND STRL 7.0 (GLOVE) ×2 IMPLANT
GLOVE BIOGEL PI INDICATOR 7.0 (GLOVE) ×2
GLOVE ECLIPSE 6.5 STRL STRAW (GLOVE) ×4 IMPLANT
GLOVE ORTHO TXT STRL SZ7.5 (GLOVE) ×4 IMPLANT
GOWN STRL REUS W/ TWL LRG LVL3 (GOWN DISPOSABLE) ×4 IMPLANT
GOWN STRL REUS W/ TWL XL LVL3 (GOWN DISPOSABLE) ×2 IMPLANT
GOWN STRL REUS W/TWL LRG LVL3 (GOWN DISPOSABLE) ×4
GOWN STRL REUS W/TWL XL LVL3 (GOWN DISPOSABLE) ×2
NEEDLE 27GAX1X1/2 (NEEDLE) ×4 IMPLANT
PACK BASIN DAY SURGERY FS (CUSTOM PROCEDURE TRAY) ×4 IMPLANT
PAD CAST 3X4 CTTN HI CHSV (CAST SUPPLIES) ×2 IMPLANT
PADDING CAST ABS 4INX4YD NS (CAST SUPPLIES) ×2
PADDING CAST ABS COTTON 4X4 ST (CAST SUPPLIES) ×2 IMPLANT
PADDING CAST COTTON 3X4 STRL (CAST SUPPLIES) ×2
SPLINT PLASTER CAST XFAST 3X15 (CAST SUPPLIES) ×10 IMPLANT
SPLINT PLASTER XTRA FASTSET 3X (CAST SUPPLIES) ×10
SPONGE GAUZE 4X4 12PLY (GAUZE/BANDAGES/DRESSINGS) ×4 IMPLANT
STOCKINETTE 4X48 STRL (DRAPES) ×4 IMPLANT
STRIP CLOSURE SKIN 1/2X4 (GAUZE/BANDAGES/DRESSINGS) ×3 IMPLANT
SUT PROLENE 3 0 PS 2 (SUTURE) ×4 IMPLANT
SYR 3ML 23GX1 SAFETY (SYRINGE) IMPLANT
SYR CONTROL 10ML LL (SYRINGE) ×4 IMPLANT
TOWEL OR 17X24 6PK STRL BLUE (TOWEL DISPOSABLE) ×4 IMPLANT
TRAY DSU PREP LF (CUSTOM PROCEDURE TRAY) ×4 IMPLANT
UNDERPAD 30X30 INCONTINENT (UNDERPADS AND DIAPERS) ×4 IMPLANT

## 2013-11-15 NOTE — Anesthesia Preprocedure Evaluation (Addendum)
Anesthesia Evaluation  Patient identified by MRN, date of birth, ID band Patient awake    Reviewed: Allergy & Precautions, H&P , NPO status , Patient's Chart, lab work & pertinent test results  Airway Mallampati: I TM Distance: >3 FB Neck ROM: Full    Dental  (+) Teeth Intact, Dental Advisory Given   Pulmonary  breath sounds clear to auscultation        Cardiovascular hypertension, Pt. on medications Rhythm:Regular Rate:Normal     Neuro/Psych    GI/Hepatic   Endo/Other  Morbid obesity  Renal/GU      Musculoskeletal   Abdominal   Peds  Hematology   Anesthesia Other Findings   Reproductive/Obstetrics                           Anesthesia Physical Anesthesia Plan  ASA: II  Anesthesia Plan: MAC   Post-op Pain Management:    Induction: Intravenous  Airway Management Planned: Simple Face Mask  Additional Equipment:   Intra-op Plan:   Post-operative Plan:   Informed Consent: I have reviewed the patients History and Physical, chart, labs and discussed the procedure including the risks, benefits and alternatives for the proposed anesthesia with the patient or authorized representative who has indicated his/her understanding and acceptance.   Dental advisory given  Plan Discussed with: CRNA, Anesthesiologist and Surgeon  Anesthesia Plan Comments:        Anesthesia Quick Evaluation

## 2013-11-15 NOTE — Discharge Instructions (Signed)

## 2013-11-15 NOTE — Op Note (Signed)
NAME:  Marc, [name]            ACCOUNT NO.:  [id number]  MEDICAL RECORD NO.:  [id number]  LOCATION:                                 FACILITY:  PHYSICIAN:  Katy Fitch. Jazira Maloney, M.D.      DATE OF BIRTH:  DATE OF PROCEDURE:  11/15/2013 DATE OF DISCHARGE:                              OPERATIVE REPORT   PREOPERATIVE DIAGNOSES: 1. Chronic entrapment neuropathy, right median nerve at carpal tunnel. 2. 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 3. Chronic stenosing tenosynovitis, right long finger A1 pulley.     Also, we identified rather significant chronic synovitis of flexor     tendons in carpal canal/ulnar bursa and significant fibrotic     tenosynovitis at A1 pulleys of right index and long fingers.  OPERATION: 1. Release of right transverse carpal ligament. 2. Through separate incision, release of right index finger A1 pulley     with a formal synovectomy of index flexor digitorum superficialis     and profundus tendons. 3. Through separate incision, release of right long finger A1 pulley     with formal tenosynovectomy of right long finger profundus and     superficialis tendons.  OPERATING SURGEON:  Katy Fitch. Bryant Saye, MD.  ASSISTANT:  Surgical technician.  ANESTHESIA:  A 2% lidocaine palmar block and flexor sheath block of right index and long fingers and block of median nerve and distal forearm and palm field block.  SUPERVISED ANESTHESIOLOGIST:  Sheldon Silvan, M.D.  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 Geoffry Paradise for management of hand numbness and trigger fingers.  Marc [name] was noted to have a sleep apnea.  He has not been using a CPAP machine.  He had a detailed discussion with Dr. Ivin Booty in the holding area, and Dr. Ivin Booty ultimately decided he was not comfortable having Marc [name] undergo general anesthesia unless he spent the night for observation of his vital signs and O2  saturation.  We chose an alternative strategy of performing surgery under local anesthesia and sedation, which Dr. Ivin Booty was comfortable proceeding with.  In the holding area, Marc [name]'s hand was carefully examined.  He was noted to have active stenosing tenosynovitis of his right index and long fingers with painful locking at the A1 pulleys.  He had flexion contracture of the index and long finger PIP joint due to his chronic stenosing tenosynovitis.  He had chronic significant carpal tunnel syndrome documented with electrodiagnostic studies in the office.  Preoperatively, he was reminded of the potential risks and benefits of surgery.  Questions were invited and answered in detail.  PROCEDURE IN DETAIL:  Marc [name] was brought to room 2 of the [facility], placed in supine position on the operating table.  In the holding area, his proper surgical sites of the right index finger, right long finger, and the right palm were identified per protocol with a marking pen.  Under Dr. Ivin Booty' direct supervision, IV sedation was provided.  After Betadine prep of his palm, 2% lidocaine was infiltrated in the flexor sheath of the right index and long fingers and in the proximal palm and around the median  nerve of distal forearm, anticipating each surgical site.  After few moments, excellent anesthesia was achieved.  The right hand and arm were then prepped with Betadine soap and solution, sterilely draped.  A pneumatic tourniquet was applied to the proximal right brachium.  Prophylactic antibiotics were not provided as this was a short soft tissue procedure.  After exsanguination of right arm with Esmarch bandage, arterial tourniquet was inflated to 220 mmHg.  A routine surgical time-out was accomplished.  Procedure commenced with oblique incision directly over the palpably thickened A1 pulleys of the right index and long fingers. Subcutaneous tissues were carefully divided  revealing rather dramatic hypertrophy of the palmar fascia.  All the fascial structures of the palm were thickened, not unlike any individual with diabetes or palmar fibromatosis would have.  With great care, the A1 pulley was identified, split with scalpel and scissors.  The tendons were delivered and found to be invested in a very thick cuff of opaque fibrous tenosynovium.  This was meticulously dissected off the superficialis and profundus tendons with excision of the prepucal fold.  After complete synovectomy, free range of motion of the index finger was accomplished.  An identical procedure was performed for the long finger with release of A1 pulley, tendon delivery, synovectomy, and assuring routine tendon glide.  The index and long finger wounds were then repaired with intradermal 3-0 Prolene and a Steri-Strip.  Attention was then directed to the proximal palm.  A 3 cm incision was fashioned paralleling the thenar crease.  Subcutaneous tissues were carefully divided revealing once again, a very thickened palmar fascia.  This was split in line of its fibers revealing the distal margin of the transverse carpal ligament.  The mid palmar space was examined and the superficial palmar arch identified.  The distal margin of the transverse carpal ligament was identified followed by sounding the canal with a [employer] risk [place] 4 elevator.  We sequentially released the transverse carpal ligament along its ulnar border extending into the distal forearm.  The volar forearm fascia was released 4 cm above the distal wrist flexion crease.  This widely opened the carpal canal.  No masses or other predicaments were noted.  Bleeding points along the margin of the released ligament were electrocauterized with bipolar forceps.  The contents of the canal were inspected.  The tenosynovium was once again quite fibrotic and opaque. No masses were noted.  The wound was then repaired with a series of segmental  intradermal 3-0 Prolene sutures and a Steri-Strip.  Additional 2% lidocaine was infiltrated along the wound margins for postoperative comfort.  Marc [name] was then placed in compressive dressing with sterile gauze, sterile Webril, and a volar plaster splint maintaining the wrist at 15 degrees dorsiflexion.  For aftercare, he will be provided a prescription for Percocet 5 mg 1 or 2 tablets p.o. q.4-6 hours p.r.n. pain, 30 tablets without refill.     Katy Fitchobert V. Jarelyn Bambach, M.D.     RVS/MEDQ  D:  11/15/2013  T:  11/15/2013  Job:  [id number]  cc:   Geoffry Paradiseichard Aronson, Dr.

## 2013-11-15 NOTE — Transfer of Care (Signed)
Immediate Anesthesia Transfer of Care Note  Patient: Marc [name]  Procedure(s) Performed: Procedure(s) with comments: RIGHT CARPAL TUNNEL RELEASE (Right) RELEASE TRIGGER FINGER/A-1 PULLEY (Right) - right index and long finger  Patient Location: PACU  Anesthesia Type:MAC  Level of Consciousness: awake, alert  and oriented  Airway & Oxygen Therapy: Patient Spontanous Breathing and Patient connected to face mask oxygen  Post-op Assessment: Report given to PACU RN and Post -op Vital signs reviewed and stable  Post vital signs: Reviewed and stable  Complications: No apparent anesthesia complications

## 2013-11-15 NOTE — Op Note (Signed)
[id number] 

## 2013-11-15 NOTE — Brief Op Note (Signed)
11/15/2013  10:20 AM  PATIENT:  Marc RobertsLarry K [name]  53 y.o. male  PRE-OPERATIVE DIAGNOSIS:  RIGHT CARPAL TUNNEL, RIGHT INDEX SENOSING SYNOVITIS, RIGHT LONG STENOSING TENOSYNOVITIS  POST-OPERATIVE DIAGNOSIS:  RIGHT CARPAL TUNNEL, RIGHT INDEX SENOSING SYNOVITIS RIGHT LONG STENOSING TENOSYNOVITIS  PROCEDURE:  Procedure(s) with comments: RIGHT CARPAL TUNNEL RELEASE (Right) RELEASE TRIGGER FINGER/A-1 PULLEY (Right) - right index and long finger  SURGEON:  Surgeon(s) and Role:    * Wyn Forsterobert V Tyese Finken Jr., MD - Primary  PHYSICIAN ASSISTANT:   ASSISTANTS: SURGICAL TECH   ANESTHESIA:   MAC  EBL:  Total I/O In: 1400 [I.V.:1400] Out: -   BLOOD ADMINISTERED:none  DRAINS: none   LOCAL MEDICATIONS USED:  XYLOCAINE   SPECIMEN:  No Specimen  DISPOSITION OF SPECIMEN:  N/A  COUNTS:  YES  TOURNIQUET:   Total Tourniquet Time Documented: Upper Arm (Right) - 37 minutes Total: Upper Arm (Right) - 37 minutes   DICTATION: .Other Dictation: Dictation Number [id number]  PLAN OF CARE: Discharge to home after PACU  PATIENT DISPOSITION:  PACU - hemodynamically stable.   Delay start of Pharmacological VTE agent (>24hrs) due to surgical blood loss or risk of bleeding: not applicable

## 2013-11-15 NOTE — Anesthesia Postprocedure Evaluation (Signed)
  Anesthesia Post-op Note  Patient: Marc RobertsLarry K [name]  Procedure(s) Performed: Procedure(s) with comments: RIGHT CARPAL TUNNEL RELEASE (Right) RELEASE TRIGGER FINGER/A-1 PULLEY (Right) - right index and long finger  Patient Location: PACU  Anesthesia Type:MAC  Level of Consciousness: awake, alert  and oriented  Airway and Oxygen Therapy: Patient Spontanous Breathing  Post-op Pain: mild  Post-op Assessment: Post-op Vital signs reviewed  Post-op Vital Signs: Reviewed  Complications: No apparent anesthesia complications

## 2013-11-17 ENCOUNTER — Encounter (HOSPITAL_BASED_OUTPATIENT_CLINIC_OR_DEPARTMENT_OTHER): Payer: Self-pay | Admitting: Orthopedic Surgery

## 2015-05-09 ENCOUNTER — Encounter: Payer: Self-pay | Admitting: Internal Medicine

## 2018-08-02 ENCOUNTER — Encounter: Payer: Self-pay | Admitting: Gastroenterology

## 2018-09-29 ENCOUNTER — Encounter: Payer: Self-pay | Admitting: Internal Medicine
